# Patient Record
Sex: Male | Born: 1990 | Race: White | Hispanic: No | Marital: Single | Smoking: Current every day smoker
Health system: Southern US, Community
[De-identification: ages and names within clinical notes are randomized; demographics above are authoritative.]

## PROBLEM LIST (undated history)

## (undated) DIAGNOSIS — R569 Unspecified convulsions: Secondary | ICD-10-CM

## (undated) DIAGNOSIS — F419 Anxiety disorder, unspecified: Secondary | ICD-10-CM

## (undated) DIAGNOSIS — K029 Dental caries, unspecified: Secondary | ICD-10-CM

---

## 2013-07-06 ENCOUNTER — Encounter (HOSPITAL_COMMUNITY): Payer: Self-pay | Admitting: Emergency Medicine

## 2013-07-06 ENCOUNTER — Emergency Department (HOSPITAL_COMMUNITY)
Admission: EM | Admit: 2013-07-06 | Discharge: 2013-07-06 | Disposition: A | Discharge status: Home / Self Care | Payer: Self-pay | Attending: Emergency Medicine | Admitting: Emergency Medicine

## 2013-07-06 DIAGNOSIS — K089 Disorder of teeth and supporting structures, unspecified: Secondary | ICD-10-CM | POA: Insufficient documentation

## 2013-07-06 DIAGNOSIS — R509 Fever, unspecified: Secondary | ICD-10-CM | POA: Insufficient documentation

## 2013-07-06 DIAGNOSIS — K0889 Other specified disorders of teeth and supporting structures: Secondary | ICD-10-CM

## 2013-07-06 DIAGNOSIS — R110 Nausea: Secondary | ICD-10-CM | POA: Insufficient documentation

## 2013-07-06 HISTORY — DX: Dental caries, unspecified: K02.9

## 2013-07-06 HISTORY — DX: Anxiety disorder, unspecified: F41.9

## 2013-07-06 HISTORY — DX: Unspecified convulsions: R56.9

## 2013-07-06 MED ORDER — PENICILLIN V POTASSIUM 500 MG PO TABS: 500.0000 mg | ORAL_TABLET | Freq: Four times a day (QID) | ORAL | Status: AC

## 2013-07-06 MED ORDER — HYDROCODONE-ACETAMINOPHEN 5-325 MG PO TABS
2.0000 | ORAL_TABLET | Freq: Once | ORAL | Status: AC
  Administered 2013-07-06: 2 via ORAL
  Filled 2013-07-06: qty 2

## 2013-07-06 MED ORDER — HYDROCODONE-ACETAMINOPHEN 5-325 MG PO TABS: 1.0000 | ORAL_TABLET | Freq: Four times a day (QID) | ORAL | Status: DC | PRN

## 2013-07-06 NOTE — ED Notes (Signed)
Pain in upper l/back mouth. Hx of similar pain

## 2013-07-06 NOTE — ED Notes (Signed)
Pt states maxillary pain onset 07/04/13, has had abscess before pain 6/10

## 2013-07-06 NOTE — ED Provider Notes (Signed)
CSN: [id number]     Arrival date & time 07/06/13  1511 History  This chart was scribed for non-physician practitioner Santiago Glad, PA-C working with Flint Melter, MD by Danella Maiers, ED Scribe. This patient was seen in room WTR8/WTR8 and the patient's care was started at 3:22 PM.  Chief Complaint  Patient presents with  . Dental Pain    pain in upper l/mouth   The history is provided by the patient. No language interpreter was used.   HPI Comments: Chester Sibert is a 22 y.o. male who presents to the Emergency Department complaining of worsening left upper dental pain onset 2 days ago. He reports mild left-sided facial swelling, subjective fever, and nausea secondary to pain. Pt has been taking 800mg  ibuprofen with no relief. Pt reports h/o dental abscess. He denies any injuries to the area. He denies emesis, difficulty swallowing.  He currently does not have a dentist.     No past medical history on file. No past surgical history on file. No family history on file. History  Substance Use Topics  . Smoking status: Not on file  . Smokeless tobacco: Not on file  . Alcohol Use: Not on file    Review of Systems  Constitutional: Positive for fever.  HENT: Positive for dental problem. Negative for trouble swallowing.   Gastrointestinal: Positive for nausea. Negative for vomiting.   A complete 10 system review of systems was obtained and all systems are negative except as noted in the HPI and PMH.   Allergies  Review of patient's allergies indicates not on file.  Home Medications  No current outpatient prescriptions on file. BP 129/84  Pulse 81  Temp(Src) 98.6 F (37 C) (Oral)  Resp 17  SpO2 97% Physical Exam  Nursing note and vitals reviewed. Constitutional: He is oriented to person, place, and time. He appears well-developed and well-nourished. No distress.  HENT:  Head: Normocephalic and atraumatic.  Mouth/Throat: Uvula is midline, oropharynx is clear and  moist and mucous membranes are normal. No trismus in the jaw. Abnormal dentition. No dental abscesses or uvula swelling. No oropharyngeal exudate, posterior oropharyngeal edema, posterior oropharyngeal erythema or tonsillar abscesses.  Poor dental hygiene. Pt able to open and close mouth with out difficulty. Airway intact. Uvula midline. Mild gingival swelling with tenderness over affected area, but no fluctuance. No swelling or tenderness of submental and submandibular regions.  No sublingual tenderness or tongue elevation.  Eyes: Conjunctivae and EOM are normal. Pupils are equal, round, and reactive to light.  Neck: Normal range of motion and full passive range of motion without pain. Neck supple.  Cardiovascular: Normal rate, regular rhythm and normal heart sounds.   Pulmonary/Chest: Effort normal and breath sounds normal.  Musculoskeletal: Normal range of motion.  Lymphadenopathy:       Head (right side): No submental, no submandibular, no tonsillar, no preauricular and no posterior auricular adenopathy present.       Head (left side): No submental, no submandibular, no tonsillar, no preauricular and no posterior auricular adenopathy present.    He has no cervical adenopathy.  Neurological: He is alert and oriented to person, place, and time.  Skin: Skin is warm and dry. No rash noted. He is not diaphoretic.  Psychiatric: He has a normal mood and affect. His behavior is normal.    ED Course  Procedures (including critical care time) Medications - No data to display  DIAGNOSTIC STUDIES: Oxygen Saturation is 97% on RA, normal by my interpretation.  COORDINATION OF CARE: 4:05 PM- Discussed treatment plan with pt which includes antibiotics, pain medication, and dentist referral. Pt agrees to plan.    Labs Review Labs Reviewed - No data to display Imaging Review No results found.  EKG Interpretation   None       MDM  No diagnosis found. Patient with toothache.  No gross  abscess.  Exam unconcerning for Ludwig's angina or spread of infection.  Will treat with penicillin and pain medicine.  Urged patient to follow-up with dentist.  Return precautions given.  I personally performed the services described in this documentation, which was scribed in my presence. The recorded information has been reviewed and is accurate.    Santiago Glad, PA-C 07/06/13 [phone]

## 2013-07-07 NOTE — ED Provider Notes (Signed)
Medical screening examination/treatment/procedure(s) were performed by non-physician practitioner and as supervising physician I was immediately available for consultation/collaboration.  Lesia Monica L Wanda Cellucci, MD 07/07/13 0039 

## 2013-09-12 ENCOUNTER — Emergency Department (HOSPITAL_COMMUNITY): Payer: BC Managed Care – PPO

## 2013-09-12 ENCOUNTER — Emergency Department (HOSPITAL_COMMUNITY)
Admission: EM | Admit: 2013-09-12 | Discharge: 2013-09-12 | Disposition: A | Discharge status: Home / Self Care | Payer: BC Managed Care – PPO | Attending: Emergency Medicine | Admitting: Emergency Medicine

## 2013-09-12 DIAGNOSIS — R0602 Shortness of breath: Secondary | ICD-10-CM | POA: Insufficient documentation

## 2013-09-12 DIAGNOSIS — F17200 Nicotine dependence, unspecified, uncomplicated: Secondary | ICD-10-CM | POA: Insufficient documentation

## 2013-09-12 DIAGNOSIS — Z8659 Personal history of other mental and behavioral disorders: Secondary | ICD-10-CM | POA: Insufficient documentation

## 2013-09-12 DIAGNOSIS — B349 Viral infection, unspecified: Secondary | ICD-10-CM

## 2013-09-12 DIAGNOSIS — B9789 Other viral agents as the cause of diseases classified elsewhere: Secondary | ICD-10-CM | POA: Insufficient documentation

## 2013-09-12 DIAGNOSIS — Z8669 Personal history of other diseases of the nervous system and sense organs: Secondary | ICD-10-CM | POA: Insufficient documentation

## 2013-09-12 DIAGNOSIS — J029 Acute pharyngitis, unspecified: Secondary | ICD-10-CM | POA: Insufficient documentation

## 2013-09-12 DIAGNOSIS — Z8719 Personal history of other diseases of the digestive system: Secondary | ICD-10-CM | POA: Insufficient documentation

## 2013-09-12 DIAGNOSIS — R1110 Vomiting, unspecified: Secondary | ICD-10-CM | POA: Insufficient documentation

## 2013-09-12 LAB — CBC WITH DIFFERENTIAL/PLATELET
Basophils Absolute: 0 10*3/uL (ref 0.0–0.1)
Basophils Relative: 0 % (ref 0–1)
EOS ABS: 0 10*3/uL (ref 0.0–0.7)
Eosinophils Relative: 0 % (ref 0–5)
HEMATOCRIT: 40.9 % (ref 39.0–52.0)
HEMOGLOBIN: 14.2 g/dL (ref 13.0–17.0)
LYMPHS ABS: 2.5 10*3/uL (ref 0.7–4.0)
Lymphocytes Relative: 16 % (ref 12–46)
MCH: 28.7 pg (ref 26.0–34.0)
MCHC: 34.7 g/dL (ref 30.0–36.0)
MCV: 82.6 fL (ref 78.0–100.0)
MONOS PCT: 8 % (ref 3–12)
Monocytes Absolute: 1.3 10*3/uL — ABNORMAL HIGH (ref 0.1–1.0)
NEUTROS PCT: 76 % (ref 43–77)
Neutro Abs: 12.2 10*3/uL — ABNORMAL HIGH (ref 1.7–7.7)
Platelets: 238 10*3/uL (ref 150–400)
RBC: 4.95 MIL/uL (ref 4.22–5.81)
RDW: 12.8 % (ref 11.5–15.5)
WBC: 16 10*3/uL — ABNORMAL HIGH (ref 4.0–10.5)

## 2013-09-12 LAB — BASIC METABOLIC PANEL
BUN: 11 mg/dL (ref 6–23)
CHLORIDE: 102 meq/L (ref 96–112)
CO2: 25 mEq/L (ref 19–32)
Calcium: 9.1 mg/dL (ref 8.4–10.5)
Creatinine, Ser: 0.67 mg/dL (ref 0.50–1.35)
GFR calc Af Amer: >90 mL/min (ref >90)
GLUCOSE: 103 mg/dL — AB (ref 70–99)
Potassium: 4 mEq/L (ref 3.7–5.3)
Sodium: 138 mEq/L (ref 137–147)

## 2013-09-12 LAB — MONONUCLEOSIS SCREEN: Mono Screen: NEGATIVE

## 2013-09-12 LAB — RAPID STREP SCREEN (MED CTR MEBANE ONLY): Streptococcus, Group A Screen (Direct): NEGATIVE

## 2013-09-12 NOTE — ED Provider Notes (Signed)
CSN: [id number]     Arrival date & time 09/12/13  1134 History   First MD Initiated Contact with Patient 09/12/13 1143     Chief Complaint  Patient presents with  . Shortness of Breath  . Fatigue  . Headache     (Consider location/radiation/quality/duration/timing/severity/associated sxs/prior Treatment) HPI Comments: Patient is a 23 year old male with no significant past medical history. Presents today with complaints of fatigue, sore throat, headache, and generalized malaise for the past 5 days. States that he feels very weak and tired. He denies any ill contacts.  Patient is a 23 y.o. male presenting with shortness of breath and headaches. The history is provided by the patient.  Shortness of Breath Severity:  Moderate Onset quality:  Gradual Duration:  5 days Timing:  Constant Progression:  Worsening Chronicity:  New Context: activity and URI   Relieved by:  Nothing Worsened by:  Nothing tried Ineffective treatments:  None tried Associated symptoms: cough, fever, headaches and vomiting   Associated symptoms: no abdominal pain and no neck pain   Headache Associated symptoms: cough, fever and vomiting   Associated symptoms: no abdominal pain and no neck pain     Past Medical History  Diagnosis Date  . Dental caries   . Anxiety   . Seizures    No past surgical history on file. Family History  Problem Relation Age of Onset  . Cancer Other   . Diabetes Other    History  Substance Use Topics  . Smoking status: Current Every Day Smoker    Types: Cigarettes  . Smokeless tobacco: Not on file  . Alcohol Use: No    Review of Systems  Constitutional: Positive for fever.  Respiratory: Positive for cough and shortness of breath.   Gastrointestinal: Positive for vomiting. Negative for abdominal pain.  Musculoskeletal: Negative for neck pain.  Neurological: Positive for headaches.  All other systems reviewed and are negative.      Allergies  Review of patient's  allergies indicates no known allergies.  Home Medications   Current Outpatient Rx  Name  Route  Sig  Dispense  Refill  . HYDROcodone-acetaminophen (NORCO/VICODIN) 5-325 MG per tablet   Oral   Take 1-2 tablets by mouth every 6 (six) hours as needed.   15 tablet   0   . ibuprofen (ADVIL,MOTRIN) 200 MG tablet   Oral   Take 600 mg by mouth every 4 (four) hours as needed.          BP 125/79  Pulse 109  Temp(Src) 99 F (37.2 C)  Resp 19  Ht 5\' 8"  (1.727 m)  Wt 135 lb (61.236 kg)  BMI 20.53 kg/m2  SpO2 100% Physical Exam  Nursing note and vitals reviewed. Constitutional: He is oriented to person, place, and time. He appears well-developed and well-nourished. No distress.  HENT:  Head: Normocephalic and atraumatic.  Mouth/Throat: Oropharynx is clear and moist.  TMs are clear bilaterally.  Neck: Normal range of motion. Neck supple.  Cardiovascular: Normal rate, regular rhythm and normal heart sounds.   No murmur heard. Pulmonary/Chest: Effort normal and breath sounds normal. No respiratory distress. He has no wheezes.  Abdominal: Soft. Bowel sounds are normal. He exhibits no distension. There is no tenderness.  Musculoskeletal: Normal range of motion. He exhibits no edema.  Lymphadenopathy:    He has no cervical adenopathy.  Neurological: He is alert and oriented to person, place, and time.  Skin: Skin is warm and dry. He is not diaphoretic.  ED Course  Procedures (including critical care time) Labs Review Labs Reviewed  RAPID STREP SCREEN  CBC WITH DIFFERENTIAL  BASIC METABOLIC PANEL  MONONUCLEOSIS SCREEN   Imaging Review No results found.    MDM   Final diagnoses:  None    Patient is a 22 year old m24ale with history of seizures. He presents today with cough, congestion and generalized malaise for the past several days. His physical examination is unremarkable with the exception of mild erythema of the oropharynx. Workup reveals an elevated white count of  16,000, however all other studies are unremarkable including rapid strep, mono screen, and electrolytes. I suspect this is a viral process and we'll recommend plenty of fluids, ibuprofen, rest, and time. He is to return if he develops any additional problems.    Geoffery Lyonsouglas Lamisha Roussell, MD 09/12/13 1251

## 2013-09-12 NOTE — Discharge Instructions (Signed)
Drink plenty of fluids and get plenty of rest.  Ibuprofen 600 mg every 6 hours as needed for fever or pain.  Return to the emergency department if you develop difficulty breathing, severe chest or abdominal pain, or other new or concerning symptoms.   Viral Infections A viral infection can be caused by different types of viruses.Most viral infections are not serious and resolve on their own. However, some infections may cause severe symptoms and may lead to further complications. SYMPTOMS Viruses can frequently cause:  Minor sore throat.  Aches and pains.  Headaches.  Runny nose.  Different types of rashes.  Watery eyes.  Tiredness.  Cough.  Loss of appetite.  Gastrointestinal infections, resulting in nausea, vomiting, and diarrhea. These symptoms do not respond to antibiotics because the infection is not caused by bacteria. However, you might catch a bacterial infection following the viral infection. This is sometimes called a "superinfection." Symptoms of such a bacterial infection may include:  Worsening sore throat with pus and difficulty swallowing.  Swollen neck glands.  Chills and a high or persistent fever.  Severe headache.  Tenderness over the sinuses.  Persistent overall ill feeling (malaise), muscle aches, and tiredness (fatigue).  Persistent cough.  Yellow, green, or brown mucus production with coughing. HOME CARE INSTRUCTIONS   Only take over-the-counter or prescription medicines for pain, discomfort, diarrhea, or fever as directed by your caregiver.  Drink enough water and fluids to keep your urine clear or pale yellow. Sports drinks can provide valuable electrolytes, sugars, and hydration.  Get plenty of rest and maintain proper nutrition. Soups and broths with crackers or rice are fine. SEEK IMMEDIATE MEDICAL CARE IF:   You have severe headaches, shortness of breath, chest pain, neck pain, or an unusual rash.  You have uncontrolled  vomiting, diarrhea, or you are unable to keep down fluids.  You or your child has an oral temperature above 102 F (38.9 C), not controlled by medicine.  Your baby is older than 3 months with a rectal temperature of 102 F (38.9 C) or higher.  Your baby is 603 months old or younger with a rectal temperature of 100.4 F (38 C) or higher. MAKE SURE YOU:   Understand these instructions.  Will watch your condition.  Will get help right away if you are not doing well or get worse. Document Released: 04/13/2005 Document Revised: 09/26/2011 Document Reviewed: 11/08/2010 [facility] Patient Information 2014 [employer], [place].

## 2013-09-12 NOTE — ED Notes (Signed)
Per pt, states hes been feeling sick since Saturday. Pt states he thought he had a cold, progressively got worse, pt c/o chest pain and issues with breathing. Pt states hes had a headache. Four days ago he threw up x3. Pt states hes not having an appetite, states when he eats he feels sick. Pt took mucous relief today. Pt c/o mid abdominal pain.

## 2013-09-14 LAB — CULTURE, GROUP A STREP

## 2017-04-04 ENCOUNTER — Encounter (HOSPITAL_COMMUNITY): Payer: Self-pay | Admitting: Emergency Medicine

## 2017-04-04 ENCOUNTER — Ambulatory Visit (INDEPENDENT_AMBULATORY_CARE_PROVIDER_SITE_OTHER): Payer: Self-pay

## 2017-04-04 ENCOUNTER — Ambulatory Visit (HOSPITAL_COMMUNITY)
Admission: EM | Admit: 2017-04-04 | Discharge: 2017-04-04 | Disposition: A | Discharge status: Home / Self Care | Payer: Self-pay | Attending: Family Medicine | Admitting: Family Medicine

## 2017-04-04 DIAGNOSIS — J40 Bronchitis, not specified as acute or chronic: Secondary | ICD-10-CM

## 2017-04-04 MED ORDER — CETIRIZINE-PSEUDOEPHEDRINE ER 5-120 MG PO TB12: 1.0000 | ORAL_TABLET | Freq: Every day | ORAL | 0 refills | Status: DC

## 2017-04-04 MED ORDER — ALBUTEROL SULFATE HFA 108 (90 BASE) MCG/ACT IN AERS: 1.0000 | INHALATION_SPRAY | Freq: Four times a day (QID) | RESPIRATORY_TRACT | 0 refills | Status: DC | PRN

## 2017-04-04 MED ORDER — PREDNISONE 20 MG PO TABS: 40.0000 mg | ORAL_TABLET | Freq: Every day | ORAL | 0 refills | Status: AC

## 2017-04-04 MED ORDER — FLUTICASONE PROPIONATE 50 MCG/ACT NA SUSP: 2.0000 | Freq: Every day | NASAL | 0 refills | Status: DC

## 2017-04-04 MED ORDER — BENZONATATE 100 MG PO CAPS: 100.0000 mg | ORAL_CAPSULE | Freq: Three times a day (TID) | ORAL | 0 refills | Status: DC

## 2017-04-04 NOTE — ED Triage Notes (Signed)
Cough onset last week.  Yesterday started getting a pain in right ear.  Patient has had occasional dizziness.    Patient says he was diagnosed with pneumonia last Thursday 9/13.  Patient was also seen for diarrhea.  Patient says he finished medications that he was prescribed and told if not feeling better to be seen again.

## 2017-04-04 NOTE — ED Provider Notes (Signed)
[facility]    CSN: [id number] Arrival date & time: 04/04/17  1314     History   Chief Complaint Chief Complaint  Patient presents with  . Cough    HPI Lonnie Harris is a 26 y.o. male.   26 year old male with history of anxiety, seizures, comes in for continued coughing after treatment for pneumonia. Patient states coughing started 1 week ago, but has also had few months history of diarrhea. He was seen on 03/30/2017 at the urgent care, where he was diagnosed with pneumonia and colitis and was treated with azithromycin and ciprofloxacin. States he was told that if symptoms do not improve, to follow-up. Patient states he continues to have productive cough, and has had right ear pain with occasional dizziness. He also continues to have shortness of breath while coughing. He's had a few episodes of vomiting due to excessive coughing. Denies fever, chills, night sweats. He states that he originally was having 7-8 episodes of diarrhea each day, which has improved since finishing ciprofloxacin. States he is now having 3-4 episodes of loose stools each day. Has had some LLQ abdominal pain that is intermittent in nature. Prior to urgent care visit 1 week ago, he has not had recent antibiotic use or travel. He has been taking probiotics since he was put on antibiotics.       Past Medical History:  Diagnosis Date  . Anxiety   . Dental caries   . Seizures (HCC)     There are no active problems to display for this patient.   History reviewed. No pertinent surgical history.     Home Medications    Prior to Admission medications   Medication Sig Start Date End Date Taking? Authorizing Provider  albuterol (PROVENTIL HFA;VENTOLIN HFA) 108 (90 Base) MCG/ACT inhaler Inhale 1-2 puffs into the lungs every 6 (six) hours as needed for wheezing or shortness of breath. 04/04/17   Cathie Hoops, Lonie Newsham V, PA-C  benzonatate (TESSALON) 100 MG capsule Take 1 capsule (100 mg total) by mouth every 8  (eight) hours. 04/04/17   Cathie Hoops, Rafaelita Foister V, PA-C  cetirizine-pseudoephedrine (ZYRTEC-D) 5-120 MG tablet Take 1 tablet by mouth daily. 04/04/17   Cathie Hoops, Avnoor Koury V, PA-C  fluticasone (FLONASE) 50 MCG/ACT nasal spray Place 2 sprays into both nostrils daily. 04/04/17   Cathie Hoops, Adren Dollins V, PA-C  ibuprofen (ADVIL,MOTRIN) 200 MG tablet Take 600 mg by mouth every 4 (four) hours as needed.    [provider]  predniSONE (DELTASONE) 20 MG tablet Take 2 tablets (40 mg total) by mouth daily. 04/04/17 04/07/17  Belinda Fisher, PA-C    Family History Family History  Problem Relation Age of Onset  . Cancer Other   . Diabetes Other     Social History Social History  Substance Use Topics  . Smoking status: Current Every Day Smoker    Types: Cigarettes  . Smokeless tobacco: Not on file  . Alcohol use No     Allergies   Hydrocodone   Review of Systems Review of Systems  Reason unable to perform ROS: See HPI as above.     Physical Exam Triage Vital Signs ED Triage Vitals  Enc Vitals Group     BP 04/04/17 1329 132/82     Pulse Rate 04/04/17 1329 85     Resp 04/04/17 1329 (!) 22     Temp 04/04/17 1329 98.6 F (37 C)     Temp Source 04/04/17 1329 Oral     SpO2 04/04/17 1329 100 %  Weight --      Height --      Head Circumference --      Peak Flow --      Pain Score 04/04/17 1327 7     Pain Loc --      Pain Edu? --      Excl. in GC? --    No data found.   Updated Vital Signs BP 132/82 (BP Location: Right Arm)   Pulse 85   Temp 98.6 F (37 C) (Oral)   Resp (!) 22   SpO2 100%   Physical Exam  Constitutional: He is oriented to person, place, and time. He appears well-developed and well-nourished. No distress.  HENT:  Head: Normocephalic and atraumatic.  Right Ear: External ear and ear canal normal. Tympanic membrane is erythematous. Tympanic membrane is not bulging.  Left Ear: Tympanic membrane, external ear and ear canal normal. Tympanic membrane is not erythematous and not bulging.  Nose:  Mucosal edema present. Right sinus exhibits no maxillary sinus tenderness and no frontal sinus tenderness. Left sinus exhibits no maxillary sinus tenderness and no frontal sinus tenderness.  Mouth/Throat: Uvula is midline and mucous membranes are normal. Posterior oropharyngeal erythema present.  Eyes: Pupils are equal, round, and reactive to light. Conjunctivae are normal.  Neck: Normal range of motion. Neck supple.  Cardiovascular: Normal rate, regular rhythm and normal heart sounds.  Exam reveals no gallop and no friction rub.   No murmur heard. Pulmonary/Chest: Effort normal and breath sounds normal. He has no decreased breath sounds. He has no wheezes. He has no rhonchi. He has no rales.  Abdominal: Soft. Bowel sounds are normal. He exhibits no distension and no mass. There is tenderness (LUQ and LLQ). There is no rebound and no guarding.  Lymphadenopathy:    He has no cervical adenopathy.  Neurological: He is alert and oriented to person, place, and time.  Skin: Skin is warm and dry.  Psychiatric: He has a normal mood and affect. His behavior is normal. Judgment normal.     UC Treatments / Results  Labs (all labs ordered are listed, but only abnormal results are displayed) Labs Reviewed - No data to display  EKG  EKG Interpretation None       Radiology Dg Chest 2 View  Result Date: 04/04/2017 CLINICAL DATA:  Cough and shortness of breath for 1 week. Recently completed antibiotics. EXAM: CHEST  2 VIEW COMPARISON:  09/12/2013 radiographs FINDINGS: The cardiomediastinal silhouette is unremarkable. There is no evidence of focal airspace disease, pulmonary edema, suspicious pulmonary nodule/mass, pleural effusion, or pneumothorax. No acute bony abnormalities are identified. IMPRESSION: No active cardiopulmonary disease. Electronically Signed   By: Harmon Pier M.D.   On: 04/04/2017 14:02    Procedures Procedures (including critical care time)  Medications Ordered in  UC Medications - No data to display   Initial Impression / Assessment and Plan / UC Course  I have reviewed the triage vital signs and the nursing notes.  Pertinent labs & imaging results that were available during my care of the patient were reviewed by me and considered in my medical decision making (see chart for details).    Xray negative for pneumonia. Discussed with patient symptoms could be due to residual reactive airway. Short course of prednisone. Albuterol inhaler for shortness of breath/wheezing. Symptomatic treatment as needed. Push fluids. Patient to continue probiotics, advance diet as tolerated. Return precautions given.    Final Clinical Impressions(s) / UC Diagnoses   Final diagnoses:  Bronchitis    New Prescriptions Discharge Medication List as of 04/04/2017  2:22 PM    START taking these medications   Details  albuterol (PROVENTIL HFA;VENTOLIN HFA) 108 (90 Base) MCG/ACT inhaler Inhale 1-2 puffs into the lungs every 6 (six) hours as needed for wheezing or shortness of breath., Starting Tue 04/04/2017, Normal    benzonatate (TESSALON) 100 MG capsule Take 1 capsule (100 mg total) by mouth every 8 (eight) hours., Starting Tue 04/04/2017, Normal    cetirizine-pseudoephedrine (ZYRTEC-D) 5-120 MG tablet Take 1 tablet by mouth daily., Starting Tue 04/04/2017, Normal    fluticasone (FLONASE) 50 MCG/ACT nasal spray Place 2 sprays into both nostrils daily., Starting Tue 04/04/2017, Normal    predniSONE (DELTASONE) 20 MG tablet Take 2 tablets (40 mg total) by mouth daily., Starting Tue 04/04/2017, Until Fri 04/07/2017, Normal          Linward Headland V, PA-C 04/04/17 1434

## 2017-04-04 NOTE — Discharge Instructions (Signed)
Xray negative for pneumonia. Tessalon for cough. Albuterol for shortness of breath and wheezing. Start flonase, zyrtec-D for nasal congestion. You can use over the counter nasal saline rinse such as neti pot for nasal congestion. Keep hydrated, your urine should be clear to pale yellow in color. Tylenol/motrin for fever and pain. Continue probiotics for diarrhea. Monitor for any worsening of symptoms, chest pain, shortness of breath, wheezing, swelling of the throat, increased abdominal pain, nausea/vomiting follow up for reevaluation.

## 2018-03-19 ENCOUNTER — Encounter (HOSPITAL_COMMUNITY): Payer: Self-pay

## 2018-03-19 ENCOUNTER — Emergency Department (HOSPITAL_COMMUNITY)
Admission: EM | Admit: 2018-03-19 | Discharge: 2018-03-19 | Disposition: A | Discharge status: Home / Self Care | Payer: Self-pay | Attending: Emergency Medicine | Admitting: Emergency Medicine

## 2018-03-19 ENCOUNTER — Other Ambulatory Visit: Payer: Self-pay

## 2018-03-19 DIAGNOSIS — F17210 Nicotine dependence, cigarettes, uncomplicated: Secondary | ICD-10-CM | POA: Insufficient documentation

## 2018-03-19 DIAGNOSIS — Z79899 Other long term (current) drug therapy: Secondary | ICD-10-CM | POA: Insufficient documentation

## 2018-03-19 DIAGNOSIS — H16001 Unspecified corneal ulcer, right eye: Secondary | ICD-10-CM | POA: Insufficient documentation

## 2018-03-19 MED ORDER — TETRACAINE HCL 0.5 % OP SOLN
2.0000 [drp] | Freq: Once | OPHTHALMIC | Status: AC
  Administered 2018-03-19: 2 [drp] via OPHTHALMIC
  Filled 2018-03-19: qty 4

## 2018-03-19 MED ORDER — OFLOXACIN 0.3 % OP SOLN: 1.0000 [drp] | Freq: Three times a day (TID) | OPHTHALMIC | 0 refills | Status: AC

## 2018-03-19 MED ORDER — FLUORESCEIN SODIUM 1 MG OP STRP
1.0000 | ORAL_STRIP | Freq: Once | OPHTHALMIC | Status: AC
  Administered 2018-03-19: 1 via OPHTHALMIC
  Filled 2018-03-19: qty 1

## 2018-03-19 NOTE — Discharge Instructions (Signed)
Please apply 1 drop to the right eye up to 3 times a day. Please schedule appointment with Opthalmologic for reevaluation after completing therapy. If you experience any changes in vision or vision loss please return to the ED.

## 2018-03-19 NOTE — ED Provider Notes (Signed)
[facility] EMERGENCY DEPARTMENT Provider Note   CSN: [id number] Arrival date & time: 03/19/18  1244     History   Chief Complaint Chief Complaint  Patient presents with  . contact stuck in eye    HPI Lonnie Harris is a 27 y.o. male.  27 y/o male with a PMH of anxiety presents to the ED with a chief complaint of right eye pain since last night. Patient states he was removing his contact lenses when he noticed a piece of the contact lens was missing. He reports the piece to be the size of  "pencil tip". Patient reports clear tearing from right eye and pain. He has not tried any therapy for symptomatic treatment. He denies any changes in vision or vision loss.      Past Medical History:  Diagnosis Date  . Anxiety   . Dental caries   . Seizures (HCC)     There are no active problems to display for this patient.   History reviewed. No pertinent surgical history.      Home Medications    Prior to Admission medications   Medication Sig Start Date End Date Taking? Authorizing Provider  albuterol (PROVENTIL HFA;VENTOLIN HFA) 108 (90 Base) MCG/ACT inhaler Inhale 1-2 puffs into the lungs every 6 (six) hours as needed for wheezing or shortness of breath. 04/04/17   Cathie Hoops, Amy V, PA-C  benzonatate (TESSALON) 100 MG capsule Take 1 capsule (100 mg total) by mouth every 8 (eight) hours. 04/04/17   Cathie Hoops, Amy V, PA-C  cetirizine-pseudoephedrine (ZYRTEC-D) 5-120 MG tablet Take 1 tablet by mouth daily. 04/04/17   Cathie Hoops, Amy V, PA-C  fluticasone (FLONASE) 50 MCG/ACT nasal spray Place 2 sprays into both nostrils daily. 04/04/17   Cathie Hoops, Amy V, PA-C  ibuprofen (ADVIL,MOTRIN) 200 MG tablet Take 600 mg by mouth every 4 (four) hours as needed.    [provider]  ofloxacin (OCUFLOX) 0.3 % ophthalmic solution Place 1 drop into the right eye 3 (three) times daily for 7 days. 03/19/18 03/26/18  Claude Manges, PA-C    Family History Family History  Problem Relation Age of Onset    . Cancer Other   . Diabetes Other     Social History Social History   Tobacco Use  . Smoking status: Current Every Day Smoker    Types: Cigarettes  Substance Use Topics  . Alcohol use: No  . Drug use: Not on file     Allergies   Hydrocodone   Review of Systems Review of Systems  HENT: Negative for sinus pressure, sinus pain and sore throat.   Eyes: Positive for pain, discharge and redness. Negative for photophobia and visual disturbance.  All other systems reviewed and are negative.    Physical Exam Updated Vital Signs BP 120/79 (BP Location: Right Arm)   Pulse 77   Temp 98.5 F (36.9 C) (Oral)   Resp 16   SpO2 99%   Physical Exam  Constitutional: He is oriented to person, place, and time. He appears well-developed and well-nourished.  HENT:  Head: Normocephalic and atraumatic.  Eyes: Right eye exhibits discharge. Right conjunctiva is injected. Right conjunctiva has no hemorrhage. Right eye exhibits normal extraocular motion. Left eye exhibits normal extraocular motion. Right pupil is round and reactive. Left pupil is round and reactive. Pupils are equal.  Slit lamp exam:      The right eye shows fluorescein uptake. The right eye shows no corneal flare, no corneal ulcer, no foreign body, no  hyphema and no hypopyon.  Clear drainage from right eye, no drainage from left eye.   Neck: Normal range of motion. Neck supple.  Cardiovascular: Normal heart sounds.  Pulmonary/Chest: Effort normal. He has no wheezes.  Abdominal: Soft.  Musculoskeletal: He exhibits no tenderness or deformity.  Neurological: He is alert and oriented to person, place, and time.  Skin: Skin is warm and dry.  Nursing note and vitals reviewed.    Visual Acuity  Right Eye Distance: 20/100 Left Eye Distance: 20/50 Bilateral Distance: (S) 20/50(without contacts)  Right Eye Near:   Left Eye Near:    Bilateral Near:       ED Treatments / Results  Labs (all labs ordered are listed, but  only abnormal results are displayed) Labs Reviewed - No data to display  EKG None  Radiology No results found.  Procedures Procedures (including critical care time)  Medications Ordered in ED Medications  tetracaine (PONTOCAINE) 0.5 % ophthalmic solution 2 drop (2 drops Right Eye Given 03/19/18 1339)  fluorescein ophthalmic strip 1 strip (1 strip Right Eye Given 03/19/18 1339)     Initial Impression / Assessment and Plan / ED Course  I have reviewed the triage vital signs and the nursing notes.  Pertinent labs & imaging results that were available during my care of the patient were reviewed by me and considered in my medical decision making (see chart for details).     Patient presents with pain in right eye after contact lens removal. He states a piece of contact lens was missing from his contact upon removing it last night. Patient notices increase in lacrimation of right eye. He denies any blurry vision or vision loss. During examination there is an ulcer present @ 6 o clock will prescribe patient ofloxacin drops and provide him referral for ophthalmology follow up after therapy completion. Return precautions provided.   Final Clinical Impressions(s) / ED Diagnoses   Final diagnoses:  Ulcer of right cornea    ED Discharge Orders         Ordered    ofloxacin (OCUFLOX) 0.3 % ophthalmic solution  3 times daily     03/19/18 1439           Claude Manges, PA-C 03/19/18 1443    Raeford Razor, MD 03/20/18 0730

## 2018-03-19 NOTE — ED Triage Notes (Signed)
Patient here with right eye drainage and discomfort. reports removed contact this am and it was ripped. Thinks part of contact stuck in eye

## 2018-06-06 ENCOUNTER — Emergency Department (HOSPITAL_COMMUNITY)
Admission: EM | Admit: 2018-06-06 | Discharge: 2018-06-06 | Disposition: A | Discharge status: Home / Self Care | Payer: Self-pay | Attending: Emergency Medicine | Admitting: Emergency Medicine

## 2018-06-06 ENCOUNTER — Other Ambulatory Visit: Payer: Self-pay

## 2018-06-06 ENCOUNTER — Encounter (HOSPITAL_COMMUNITY): Payer: Self-pay | Admitting: Emergency Medicine

## 2018-06-06 DIAGNOSIS — R1012 Left upper quadrant pain: Secondary | ICD-10-CM | POA: Insufficient documentation

## 2018-06-06 DIAGNOSIS — Z79899 Other long term (current) drug therapy: Secondary | ICD-10-CM | POA: Insufficient documentation

## 2018-06-06 DIAGNOSIS — F17210 Nicotine dependence, cigarettes, uncomplicated: Secondary | ICD-10-CM | POA: Insufficient documentation

## 2018-06-06 DIAGNOSIS — F419 Anxiety disorder, unspecified: Secondary | ICD-10-CM | POA: Insufficient documentation

## 2018-06-06 LAB — URINALYSIS, ROUTINE W REFLEX MICROSCOPIC
Bilirubin Urine: NEGATIVE
Glucose, UA: NEGATIVE mg/dL
Hgb urine dipstick: NEGATIVE
Ketones, ur: NEGATIVE mg/dL
Leukocytes, UA: NEGATIVE
NITRITE: NEGATIVE
Protein, ur: NEGATIVE mg/dL
Specific Gravity, Urine: 1.003 — ABNORMAL LOW (ref 1.005–1.030)
pH: 6 (ref 5.0–8.0)

## 2018-06-06 LAB — CBC WITH DIFFERENTIAL/PLATELET
Abs Immature Granulocytes: 0.04 10*3/uL (ref 0.00–0.07)
BASOS PCT: 0 %
Basophils Absolute: 0 10*3/uL (ref 0.0–0.1)
Eosinophils Absolute: 0.3 10*3/uL (ref 0.0–0.5)
Eosinophils Relative: 2 %
HCT: 45.9 % (ref 39.0–52.0)
Hemoglobin: 14.8 g/dL (ref 13.0–17.0)
Immature Granulocytes: 0 %
Lymphocytes Relative: 32 %
Lymphs Abs: 3.4 10*3/uL (ref 0.7–4.0)
MCH: 27.4 pg (ref 26.0–34.0)
MCHC: 32.2 g/dL (ref 30.0–36.0)
MCV: 84.8 fL (ref 80.0–100.0)
Monocytes Absolute: 0.7 10*3/uL (ref 0.1–1.0)
Monocytes Relative: 6 %
Neutro Abs: 6.2 10*3/uL (ref 1.7–7.7)
Neutrophils Relative %: 60 %
PLATELETS: 302 10*3/uL (ref 150–400)
RBC: 5.41 MIL/uL (ref 4.22–5.81)
RDW: 12.8 % (ref 11.5–15.5)
WBC: 10.6 10*3/uL — AB (ref 4.0–10.5)
nRBC: 0 % (ref 0.0–0.2)

## 2018-06-06 LAB — COMPREHENSIVE METABOLIC PANEL
ALT: 40 U/L (ref 0–44)
AST: 30 U/L (ref 15–41)
Albumin: 4.4 g/dL (ref 3.5–5.0)
Alkaline Phosphatase: 55 U/L (ref 38–126)
Anion gap: 8 (ref 5–15)
BUN: 7 mg/dL (ref 6–20)
CHLORIDE: 104 mmol/L (ref 98–111)
CO2: 26 mmol/L (ref 22–32)
Calcium: 9.7 mg/dL (ref 8.9–10.3)
Creatinine, Ser: 0.84 mg/dL (ref 0.61–1.24)
GFR calc Af Amer: >60 mL/min (ref >60)
Glucose, Bld: 88 mg/dL (ref 70–99)
POTASSIUM: 3.4 mmol/L — AB (ref 3.5–5.1)
SODIUM: 138 mmol/L (ref 135–145)
Total Bilirubin: 0.8 mg/dL (ref 0.3–1.2)
Total Protein: 7.4 g/dL (ref 6.5–8.1)

## 2018-06-06 LAB — LIPASE, BLOOD: LIPASE: 33 U/L (ref 11–51)

## 2018-06-06 MED ORDER — IBUPROFEN 600 MG PO TABS: 600.0000 mg | ORAL_TABLET | Freq: Four times a day (QID) | ORAL | 0 refills | Status: DC | PRN

## 2018-06-06 MED ORDER — ONDANSETRON HCL 4 MG/2ML IJ SOLN
4.0000 mg | Freq: Once | INTRAMUSCULAR | Status: AC
  Administered 2018-06-06: 4 mg via INTRAVENOUS
  Filled 2018-06-06: qty 2

## 2018-06-06 MED ORDER — SODIUM CHLORIDE 0.9 % IV SOLN
1000.0000 mL | Freq: Once | INTRAVENOUS | Status: AC
  Administered 2018-06-06: 1000 mL via INTRAVENOUS

## 2018-06-06 MED ORDER — CYCLOBENZAPRINE HCL 5 MG PO TABS: 5.0000 mg | ORAL_TABLET | Freq: Two times a day (BID) | ORAL | 0 refills | Status: DC | PRN

## 2018-06-06 MED ORDER — MORPHINE SULFATE (PF) 4 MG/ML IV SOLN
4.0000 mg | Freq: Once | INTRAVENOUS | Status: AC
  Administered 2018-06-06: 4 mg via INTRAVENOUS
  Filled 2018-06-06: qty 1

## 2018-06-06 NOTE — ED Triage Notes (Signed)
Pt complains of back pain since last Thursday, 11/14, abd pains for the last 3 days, and diarrhea that started this morning. Pt reports 6 bouts of diarrhea since this morning. Pt denies any injuries.

## 2018-06-06 NOTE — ED Provider Notes (Signed)
[facility]ERGENC[facility] Provider Note   CSN: [id number] Arrival date & time: 06/06/18  1901     History   Chief Complaint No chief complaint on file.   HPI Lonnie Harris is a 27 y.o. male.  The history is provided by the patient and medical records. No language interpreter was used.     27 year old male with history of anxiety, prior seizures, presenting for evaluation of abdominal pain.  Patient reports 6 days ago he developed pain primarily to the left side of his abdomen radiates to his back.  Pain is been persistent, described as a dull sensation with occasional sharp pain worsening with movement with bending over.  For the past 2 days he endorsed feeling nauseous, and has had persistent loose stools with some mucus.  Pain is been waxing and waning, currently rates the pain as 4 out of 10.  He endorsed persistent nausea but without vomiting.  Does not report of any fever chills, chest pain, shortness of breath, productive cough, dysuria, hematuria, hematochezia or melena.  He tries taking ibuprofen at home with some improvement.  He was seen by urgent care earlier today for his symptoms but was recommended to come to the ER for further evaluation of his pain.  Denies alcohol use, he does vape.  He denies any recent travel, eating exotic food, or any recent antibiotic use.  Patient also denies any testicular pain or scrotal pain.  No penile discharge per  Past Medical History:  Diagnosis Date  . Anxiety   . Dental caries   . Seizures (HCC)     There are no active problems to display for this patient.   No past surgical history on file.      Home Medications    Prior to Admission medications   Medication Sig Start Date End Date Taking? Authorizing Provider  albuterol (PROVENTIL HFA;VENTOLIN HFA) 108 (90 Base) MCG/ACT inhaler Inhale 1-2 puffs into the lungs every 6 (six) hours as needed for wheezing or shortness of breath. 04/04/17   Cathie HoopsYu, Amy  V, PA-C  benzonatate (TESSALON) 100 MG capsule Take 1 capsule (100 mg total) by mouth every 8 (eight) hours. 04/04/17   Cathie HoopsYu, Amy V, PA-C  cetirizine-pseudoephedrine (ZYRTEC-D) 5-120 MG tablet Take 1 tablet by mouth daily. 04/04/17   Cathie HoopsYu, Amy V, PA-C  fluticasone (FLONASE) 50 MCG/ACT nasal spray Place 2 sprays into both nostrils daily. 04/04/17   Cathie HoopsYu, Amy V, PA-C  ibuprofen (ADVIL,MOTRIN) 200 MG tablet Take 600 mg by mouth every 4 (four) hours as needed.    [provider]    Family History Family History  Problem Relation Age of Onset  . Cancer Other   . Diabetes Other     Social History Social History   Tobacco Use  . Smoking status: Current Every Day Smoker    Types: Cigarettes  Substance Use Topics  . Alcohol use: No  . Drug use: Not on file     Allergies   Hydrocodone   Review of Systems Review of Systems  All other systems reviewed and are negative.    Physical Exam Updated Vital Signs There were no vitals taken for this visit.  Physical Exam  Constitutional: He appears well-developed and well-nourished. No distress.  HENT:  Head: Atraumatic.  Eyes: Conjunctivae are normal.  Neck: Neck supple.  Cardiovascular: Normal rate and regular rhythm.  Pulmonary/Chest: Effort normal and breath sounds normal.  Abdominal: Soft. Normal appearance. There is tenderness in the left upper  quadrant. There is no CVA tenderness, no tenderness at McBurney's point and negative Murphy's sign.  Neurological: He is alert.  Skin: No rash noted.  Psychiatric: He has a normal mood and affect.  Nursing note and vitals reviewed.    ED Treatments / Results  Labs (all labs ordered are listed, but only abnormal results are displayed) Labs Reviewed  CBC WITH DIFFERENTIAL/PLATELET - Abnormal; Notable for the following components:      Result Value   WBC 10.6 (*)    All other components within normal limits  COMPREHENSIVE METABOLIC PANEL - Abnormal; Notable for the following  components:   Potassium 3.4 (*)    All other components within normal limits  URINALYSIS, ROUTINE W REFLEX MICROSCOPIC - Abnormal; Notable for the following components:   Color, Urine STRAW (*)    Specific Gravity, Urine 1.003 (*)    All other components within normal limits  LIPASE, BLOOD    EKG None  Radiology No results found.  Procedures Procedures (including critical care time)  Medications Ordered in ED Medications  0.9 %  sodium chloride infusion (0 mLs Intravenous Stopped 06/06/18 2040)  morphine 4 MG/ML injection 4 mg (4 mg Intravenous Given 06/06/18 1934)  ondansetron (ZOFRAN) injection 4 mg (4 mg Intravenous Given 06/06/18 1934)     Initial Impression / Assessment and Plan / ED Course  I have reviewed the triage vital signs and the nursing notes.  Pertinent labs & imaging results that were available during my care of the patient were reviewed by me and considered in my medical decision making (see chart for details).     BP (!) 106/59   Pulse 67   Temp 98.2 F (36.8 C) (Oral)   Resp 16   Ht 5\' 10"  (1.778 m)   Wt 61.2 kg   SpO2 99%   BMI 19.36 kg/m    Final Clinical Impressions(s) / ED Diagnoses   Final diagnoses:  LUQ pain    ED Discharge Orders         Ordered    ibuprofen (ADVIL,MOTRIN) 600 MG tablet  Every 6 hours PRN     06/06/18 2111    cyclobenzaprine (FLEXERIL) 5 MG tablet  2 times daily PRN     06/06/18 2111         7:20 PM Patient here with pain to his left upper quadrant and back ongoing for nearly a week.  He does have some reproducible pain to his left upper quadrant on exam but no tenderness to right lower quadrant and no CVA tenderness.  9:08 PM Pain did improve after treatment.  Labs are reassuring.  Normal lipase, normal UA.  Doubt acute emergent medical condition.  Pt given strict return precaution.  Pain likely MSK, will prescribe NSAIDs and muscle relaxant.     Fayrene Helper, PA-C 06/06/18 2112    Azalia Bilis,  MD 06/07/18 [phone]

## 2018-06-06 NOTE — ED Notes (Signed)
Patient verbalizes understanding of discharge instructions. Opportunity for questioning and answers were provided. Armband removed by staff, pt discharged from ED home via POV with family. 

## 2018-11-30 ENCOUNTER — Emergency Department (HOSPITAL_COMMUNITY): Payer: Self-pay

## 2018-11-30 ENCOUNTER — Other Ambulatory Visit: Payer: Self-pay

## 2018-11-30 ENCOUNTER — Encounter (HOSPITAL_COMMUNITY): Payer: Self-pay

## 2018-11-30 ENCOUNTER — Emergency Department (HOSPITAL_COMMUNITY)
Admission: EM | Admit: 2018-11-30 | Discharge: 2018-12-01 | Disposition: A | Discharge status: Home / Self Care | Payer: Self-pay | Attending: Emergency Medicine | Admitting: Emergency Medicine

## 2018-11-30 DIAGNOSIS — F17210 Nicotine dependence, cigarettes, uncomplicated: Secondary | ICD-10-CM | POA: Insufficient documentation

## 2018-11-30 DIAGNOSIS — R569 Unspecified convulsions: Secondary | ICD-10-CM | POA: Insufficient documentation

## 2018-11-30 DIAGNOSIS — K529 Noninfective gastroenteritis and colitis, unspecified: Secondary | ICD-10-CM | POA: Insufficient documentation

## 2018-11-30 LAB — COMPREHENSIVE METABOLIC PANEL
ALT: 16 U/L (ref 0–44)
AST: 16 U/L (ref 15–41)
Albumin: 4.3 g/dL (ref 3.5–5.0)
Alkaline Phosphatase: 61 U/L (ref 38–126)
Anion gap: 8 (ref 5–15)
BUN: 5 mg/dL — ABNORMAL LOW (ref 6–20)
CO2: 25 mmol/L (ref 22–32)
Calcium: 9.5 mg/dL (ref 8.9–10.3)
Chloride: 105 mmol/L (ref 98–111)
Creatinine, Ser: 0.96 mg/dL (ref 0.61–1.24)
GFR calc Af Amer: >60 mL/min (ref >60)
GFR calc non Af Amer: >60 mL/min (ref >60)
Glucose, Bld: 93 mg/dL (ref 70–99)
Potassium: 3.7 mmol/L (ref 3.5–5.1)
Sodium: 138 mmol/L (ref 135–145)
Total Bilirubin: 1.2 mg/dL (ref 0.3–1.2)
Total Protein: 6.9 g/dL (ref 6.5–8.1)

## 2018-11-30 LAB — CBC
HCT: 43 % (ref 39.0–52.0)
Hemoglobin: 14.5 g/dL (ref 13.0–17.0)
MCH: 28.4 pg (ref 26.0–34.0)
MCHC: 33.7 g/dL (ref 30.0–36.0)
MCV: 84.1 fL (ref 80.0–100.0)
Platelets: 289 10*3/uL (ref 150–400)
RBC: 5.11 MIL/uL (ref 4.22–5.81)
RDW: 12.7 % (ref 11.5–15.5)
WBC: 8 10*3/uL (ref 4.0–10.5)
nRBC: 0 % (ref 0.0–0.2)

## 2018-11-30 LAB — LIPASE, BLOOD: Lipase: 67 U/L — ABNORMAL HIGH (ref 11–51)

## 2018-11-30 MED ORDER — FENTANYL CITRATE (PF) 100 MCG/2ML IJ SOLN
25.0000 ug | Freq: Once | INTRAMUSCULAR | Status: AC
  Administered 2018-11-30: 25 ug via INTRAVENOUS
  Filled 2018-11-30: qty 2

## 2018-11-30 MED ORDER — ONDANSETRON HCL 4 MG/2ML IJ SOLN
4.0000 mg | Freq: Once | INTRAMUSCULAR | Status: AC
  Administered 2018-11-30: 4 mg via INTRAVENOUS
  Filled 2018-11-30: qty 2

## 2018-11-30 MED ORDER — ONDANSETRON 4 MG PO TBDP: 4.0000 mg | ORAL_TABLET | Freq: Three times a day (TID) | ORAL | 0 refills | Status: AC | PRN

## 2018-11-30 MED ORDER — PANTOPRAZOLE SODIUM 20 MG PO TBEC: 20.0000 mg | DELAYED_RELEASE_TABLET | Freq: Every day | ORAL | 0 refills | Status: AC

## 2018-11-30 MED ORDER — SODIUM CHLORIDE 0.9 % IV BOLUS
1000.0000 mL | Freq: Once | INTRAVENOUS | Status: AC
  Administered 2018-11-30: 23:00:00 1000 mL via INTRAVENOUS

## 2018-11-30 MED ORDER — SODIUM CHLORIDE 0.9% FLUSH: 3.0000 mL | Freq: Once | INTRAVENOUS | Status: DC

## 2018-11-30 MED ORDER — IOHEXOL 300 MG/ML  SOLN
100.0000 mL | Freq: Once | INTRAMUSCULAR | Status: AC | PRN
  Administered 2018-11-30: 100 mL via INTRAVENOUS

## 2018-11-30 NOTE — ED Provider Notes (Signed)
[facility] EMERGENCY DEPARTMENT Provider Note   CSN: [id number] Arrival date & time: 11/30/18  1910    History   Chief Complaint Chief Complaint  Patient presents with  . Abdominal Pain  . Emesis  . Diarrhea    HPI Lonnie Harris is a 28 y.o. male.     28 year old male with history of dental caries and seizures presents to the ED for complaints of abdominal pain x3 days.  Reports that abdominal pain has been on the left side.  It is nonradiating, constant.  Symptoms associated with nausea as well as vomiting and diarrhea.  He has had approximately 5-6 episodes of emesis per day.  Went to urgent care earlier today and was given antiemetics and Imodium.  Told to present to the ED if he was unable to tolerate fluids.  Tried drinking some Gatorade at home, but vomited shortly after.  Denies any sick contacts as well as any fevers, hematemesis, dysuria, melena, hematochezia, history of abdominal surgeries.  Denies taking any additional medications prior to arrival.  The history is provided by the patient. No language interpreter was used.  Abdominal Pain  Associated symptoms: diarrhea and vomiting   Emesis  Associated symptoms: abdominal pain and diarrhea   Diarrhea  Associated symptoms: abdominal pain and vomiting     Past Medical History:  Diagnosis Date  . Anxiety   . Dental caries   . Seizures (HCC)     There are no active problems to display for this patient.   History reviewed. No pertinent surgical history.      Home Medications    Prior to Admission medications   Medication Sig Start Date End Date Taking? Authorizing Provider  loperamide (IMODIUM A-D) 2 MG tablet Take 2 mg by mouth 4 (four) times daily as needed for diarrhea or loose stools.   Yes [provider]  ondansetron (ZOFRAN) 8 MG tablet Take 8 mg by mouth every 6 (six) hours as needed for nausea or vomiting.   Yes [provider]  ondansetron (ZOFRAN ODT) 4  MG disintegrating tablet Take 1 tablet (4 mg total) by mouth every 8 (eight) hours as needed for nausea or vomiting. 11/30/18   Antony Madura, PA-C  pantoprazole (PROTONIX) 20 MG tablet Take 1 tablet (20 mg total) by mouth daily. 11/30/18   Antony Madura, PA-C    Family History Family History  Problem Relation Age of Onset  . Cancer Other   . Diabetes Other     Social History Social History   Tobacco Use  . Smoking status: Current Every Day Smoker    Types: Cigarettes  . Smokeless tobacco: Never Used  Substance Use Topics  . Alcohol use: No  . Drug use: Yes    Frequency: 7.0 times per week    Types: Marijuana     Allergies   Hydrocodone   Review of Systems Review of Systems  Gastrointestinal: Positive for abdominal pain, diarrhea and vomiting.  Ten systems reviewed and are negative for acute change, except as noted in the HPI.    Physical Exam Updated Vital Signs BP 123/87 (BP Location: Right Arm)   Pulse 72   Temp 98.8 F (37.1 C) (Oral)   Resp 16   Ht  (1.778 m)   Wt 62.6 kg   SpO2 100%   BMI 19.80 kg/m   Physical Exam Vitals signs and nursing note reviewed.  Constitutional:      General: He is not in acute distress.  Appearance: He is well-developed. He is not diaphoretic.     Comments: Nontoxic appearing and in NAD  HENT:     Head: Normocephalic and atraumatic.  Eyes:     General: No scleral icterus.    Conjunctiva/sclera: Conjunctivae normal.  Neck:     Musculoskeletal: Normal range of motion.  Cardiovascular:     Rate and Rhythm: Normal rate and regular rhythm.     Pulses: Normal pulses.  Pulmonary:     Effort: Pulmonary effort is normal. No respiratory distress.     Comments: Respirations even and unlabored Abdominal:     Comments: TTP in the LUQ and suprapubic abdomen. No distension, peritoneal signs, involuntary guarding.  Musculoskeletal: Normal range of motion.  Skin:    General: Skin is warm and dry.     Coloration: Skin is not  pale.     Findings: No erythema or rash.  Neurological:     Mental Status: He is alert and oriented to person, place, and time.     Coordination: Coordination normal.  Psychiatric:        Behavior: Behavior normal.      ED Treatments / Results  Labs (all labs ordered are listed, but only abnormal results are displayed) Labs Reviewed  LIPASE, BLOOD - Abnormal; Notable for the following components:      Result Value   Lipase 67 (*)    All other components within normal limits  COMPREHENSIVE METABOLIC PANEL - Abnormal; Notable for the following components:   BUN 5 (*)    All other components within normal limits  URINALYSIS, ROUTINE W REFLEX MICROSCOPIC - Abnormal; Notable for the following components:   Color, Urine STRAW (*)    Specific Gravity, Urine 1.040 (*)    Ketones, ur 5 (*)    All other components within normal limits  CBC    EKG None  Radiology Ct Abdomen Pelvis W Contrast  Result Date: 11/30/2018 CLINICAL DATA:  Abdominal pain, nausea and diarrhea for 2 days. EXAM: CT ABDOMEN AND PELVIS WITH CONTRAST TECHNIQUE: Multidetector CT imaging of the abdomen and pelvis was performed using the standard protocol following bolus administration of intravenous contrast. CONTRAST:  100 mL OMNIPAQUE IOHEXOL 300 MG/ML  SOLN COMPARISON:  None. FINDINGS: Lower chest: Lung bases clear. Heart size normal. No pneumothorax or pleural fluid. Hepatobiliary: No focal liver abnormality is seen. No gallstones, gallbladder wall thickening, or biliary dilatation. Pancreas: Unremarkable. No pancreatic ductal dilatation or surrounding inflammatory changes. Spleen: Normal in size without focal abnormality. Adrenals/Urinary Tract: Adrenal glands are unremarkable. Kidneys are normal, without renal calculi, focal lesion, or hydronephrosis. Bladder is unremarkable. Stomach/Bowel: Stomach is within normal limits. Appendix appears normal. No evidence of bowel wall thickening, distention, or inflammatory  changes. Vascular/Lymphatic: No significant vascular findings are present. No enlarged abdominal or pelvic lymph nodes. Reproductive: Prostate is unremarkable. Other: None. Musculoskeletal: Negative. IMPRESSION: Negative CT abdomen and pelvis. No finding to explain the patient's symptoms. Electronically Signed   By: Drusilla Kanner M.D.   On: 11/30/2018 23:31    Procedures Procedures (including critical care time)  Medications Ordered in ED Medications  sodium chloride 0.9 % bolus 1,000 mL (0 mLs Intravenous Stopped 12/01/18 0026)  ondansetron (ZOFRAN) injection 4 mg (4 mg Intravenous Given 11/30/18 2239)  fentaNYL (SUBLIMAZE) injection 25 mcg (25 mcg Intravenous Given 11/30/18 2241)  iohexol (OMNIPAQUE) 300 MG/ML solution 100 mL (100 mLs Intravenous Contrast Given 11/30/18 2303)     Initial Impression / Assessment and Plan / ED Course  I have reviewed the triage vital signs and the nursing notes.  Pertinent labs & imaging results that were available during my care of the patient were reviewed by me and considered in my medical decision making (see chart for details).        Patient with symptoms consistent with viral gastroenteritis.  Vitals are stable, no fever.  Tolerating PO fluids after Zofran and IVF.  Patient's laboratory evaluation is reassuring.  He did undergo CT scan of the abdomen and pelvis which does not show any acute or emergent cause of symptoms.  He is feeling better on repeat assessment.  Will continue with outpatient management with Zofran.  Encouraged follow-up with a primary care doctor.  Return precautions discussed and provided. Patient discharged in stable condition with no unaddressed concerns.   Final Clinical Impressions(s) / ED Diagnoses   Final diagnoses:  Gastroenteritis    ED Discharge Orders         Ordered    ondansetron (ZOFRAN ODT) 4 MG disintegrating tablet  Every 8 hours PRN     11/30/18 2351    pantoprazole (PROTONIX) 20 MG tablet  Daily      11/30/18 2351           Antony Madura, PA-C 12/01/18 0331    Tilden Fossa, MD 12/01/18 2312

## 2018-11-30 NOTE — ED Notes (Signed)
Pt unable to void at this time. 

## 2018-11-30 NOTE — Discharge Instructions (Signed)
Avoid fried foods, fatty foods, greasy foods, and milk products until symptoms resolve. Drink plenty of clear liquids to prevent dehydration. We recommend the use of Zofran as prescribed for nausea/vomiting. Follow-up with your primary care doctor to ensure resolution of symptoms. 

## 2018-11-30 NOTE — ED Triage Notes (Signed)
Pt reports generalized abd pain, nausea and diarrhea of 2 days. Pt seen at [facility] and told it could be gastritis, sent here for further eval. Pt a.o, nad noted

## 2018-12-01 LAB — URINALYSIS, ROUTINE W REFLEX MICROSCOPIC
Bilirubin Urine: NEGATIVE
Glucose, UA: NEGATIVE mg/dL
Hgb urine dipstick: NEGATIVE
Ketones, ur: 5 mg/dL — AB
Leukocytes,Ua: NEGATIVE
Nitrite: NEGATIVE
Protein, ur: NEGATIVE mg/dL
Specific Gravity, Urine: 1.04 — ABNORMAL HIGH (ref 1.005–1.030)
pH: 6 (ref 5.0–8.0)

## 2019-10-21 IMAGING — CT CT ABDOMEN AND PELVIS WITH CONTRAST
2 of 4 series · 16 of 46 positions shown, 18 images · IV contrast (APPLIED)
Comparison: None.

CLINICAL DATA: Abdominal pain, nausea and diarrhea for 2 days.

EXAM:
CT ABDOMEN AND PELVIS WITH CONTRAST
TECHNIQUE: Multidetector CT imaging of the abdomen and pelvis was performed
using the standard protocol following bolus administration of
intravenous contrast.
CONTRAST:  100 mL OMNIPAQUE IOHEXOL 300 MG/ML  SOLN

[Series 3: abdomen 5.0 · axial · 0.83mm/px · z∈[+688,+1093]mm · 13 of 93 slices shown, 15 images]
[im 6/93  soft-tissue]
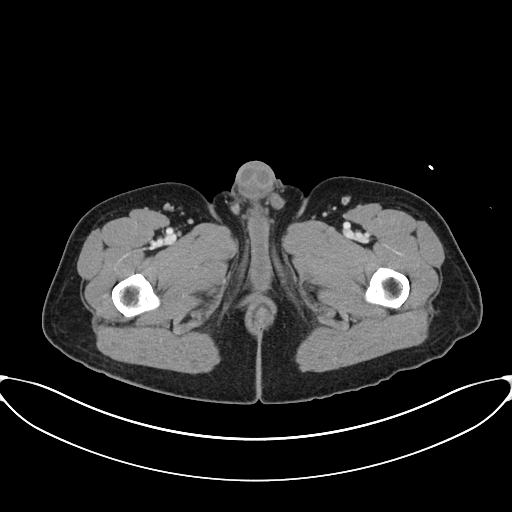
[im 6/93  bone]
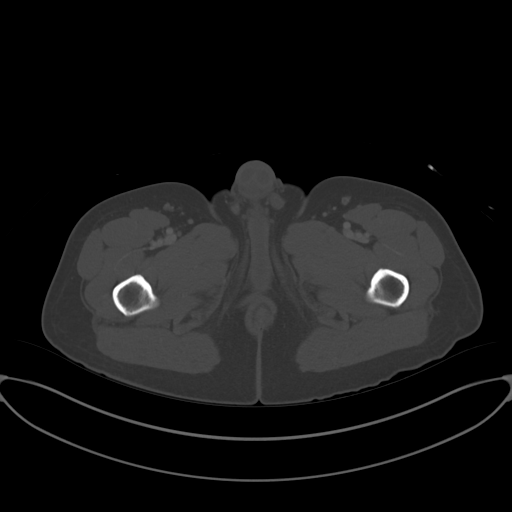
[im 12/93  soft-tissue]
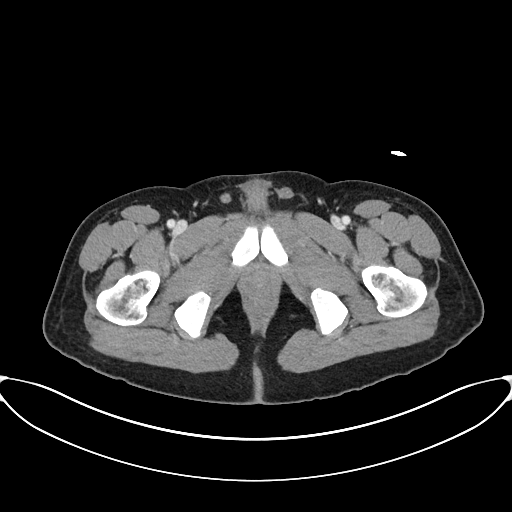
[im 18/93  soft-tissue]
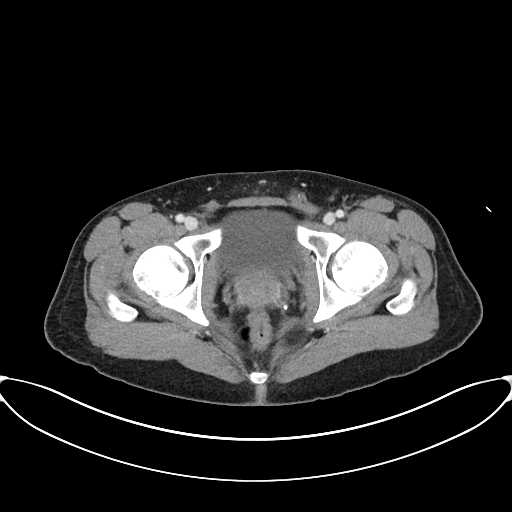
[im 29/93  soft-tissue]
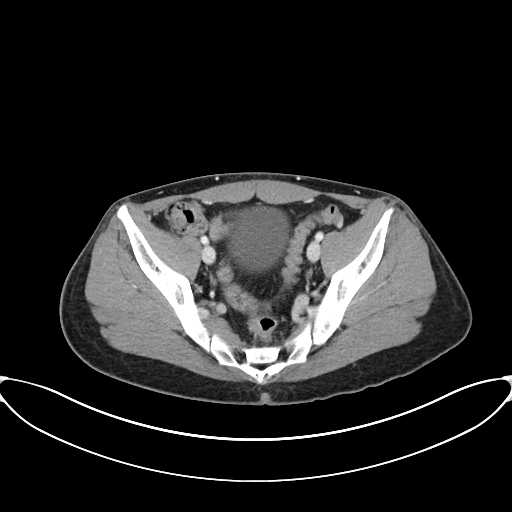
[im 35/93  soft-tissue]
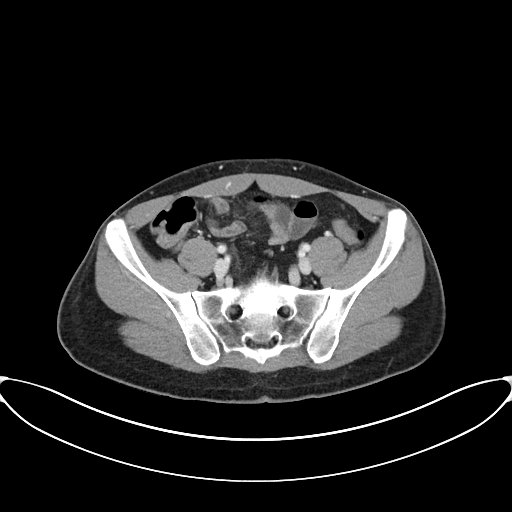
[im 41/93  soft-tissue]
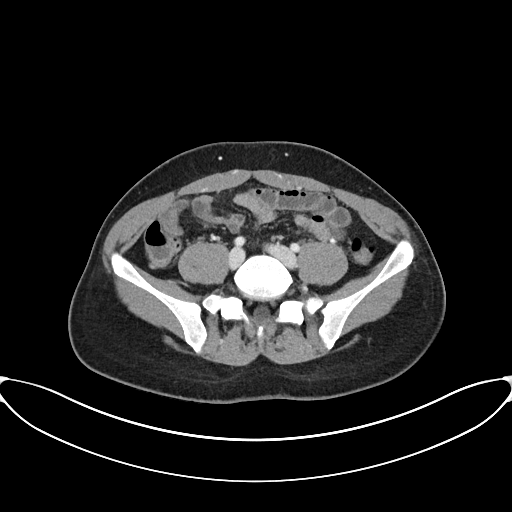
[im 47/93  soft-tissue]
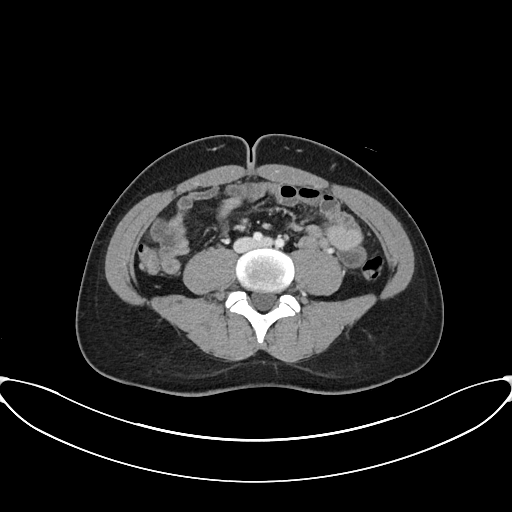
[im 52/93  soft-tissue]
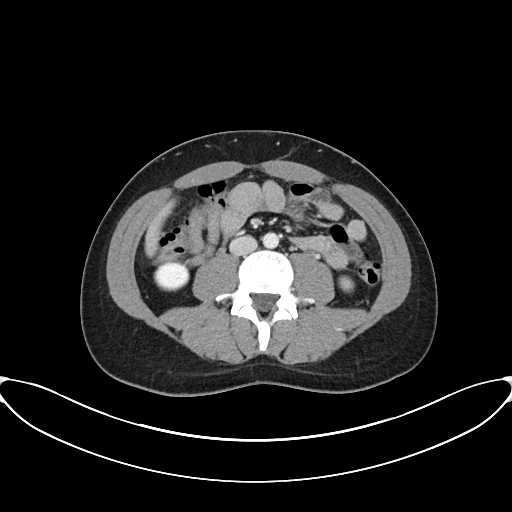
[im 58/93  soft-tissue]
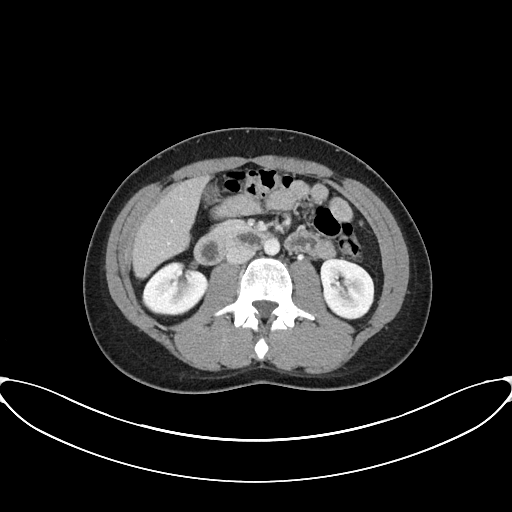
[im 58/93  bone]
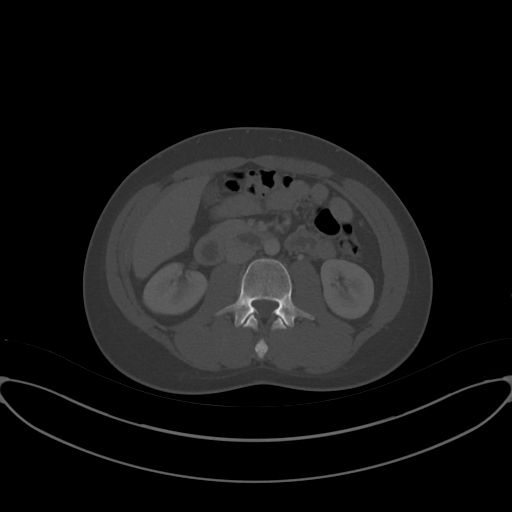
[im 64/93  soft-tissue]
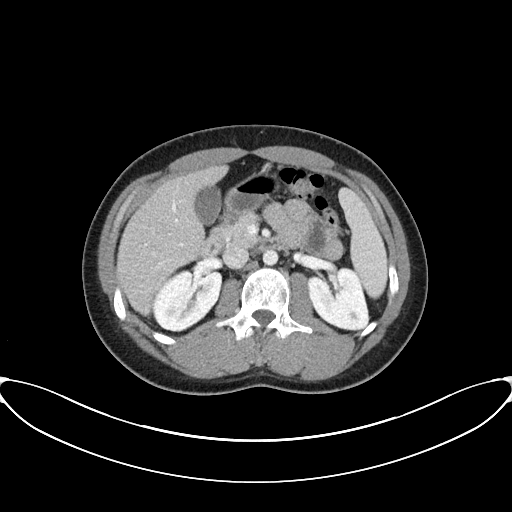
[im 75/93  soft-tissue]
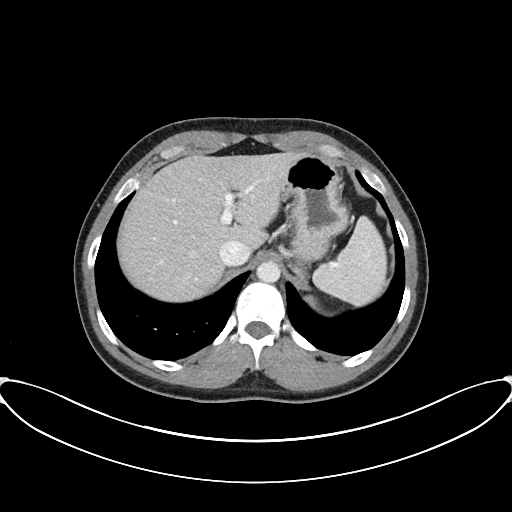
[im 81/93  soft-tissue]
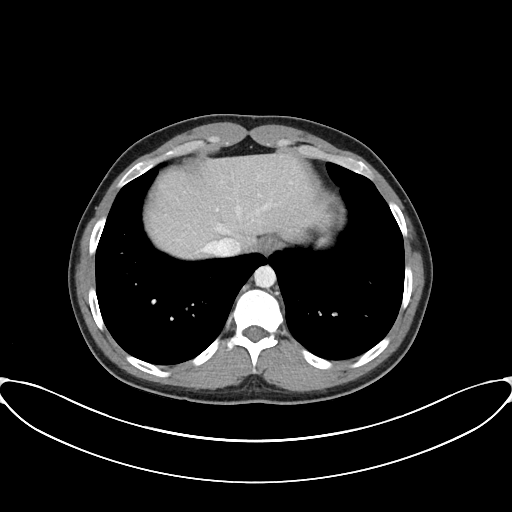
[im 87/93  soft-tissue]
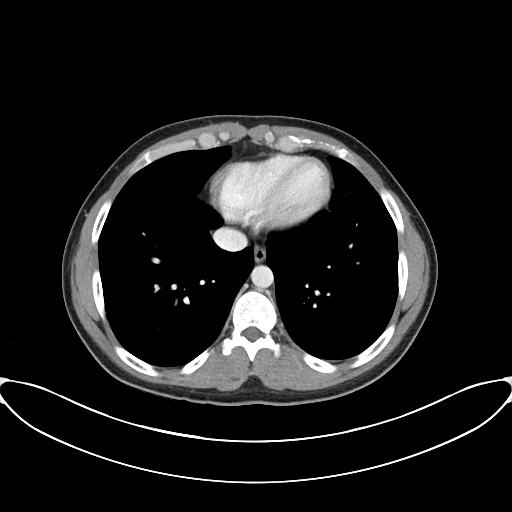

[Series 6: abdomen 3.0 mpr cor · coronal · 0.66mm/px · 3 of 100 slices shown]
[im 34/100  soft-tissue]
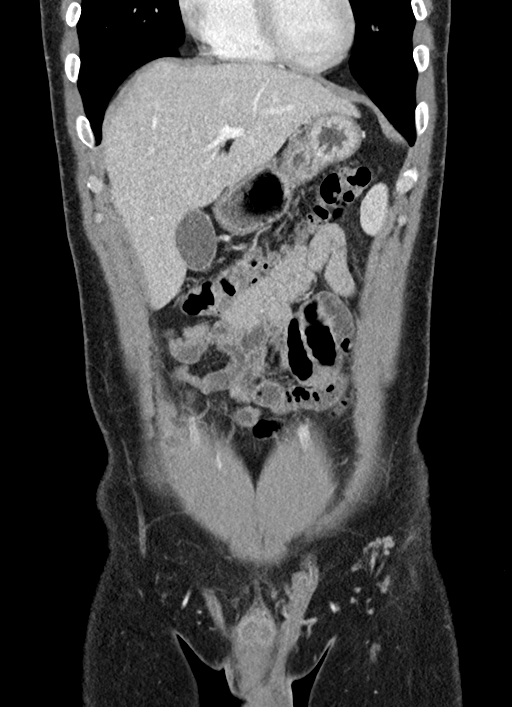
[im 45/100  soft-tissue]
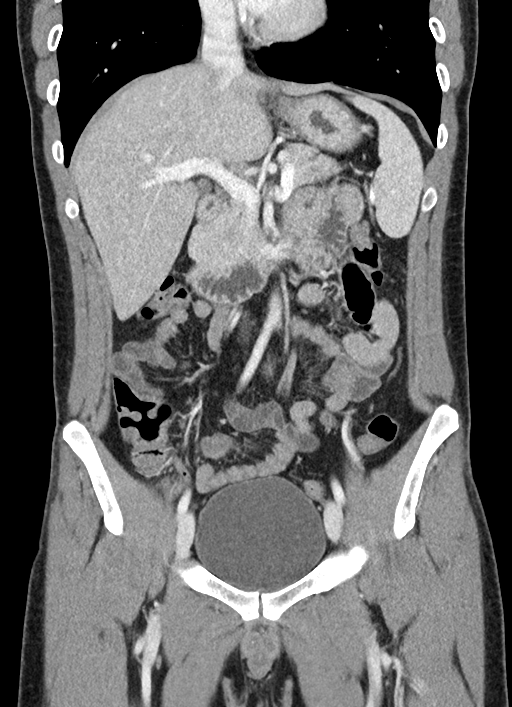
[im 56/100  soft-tissue]
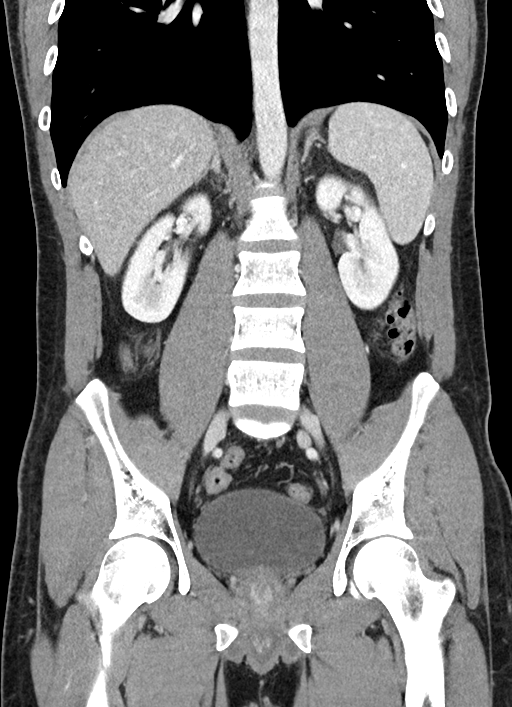

[16 of 46 positions shown; findings below may reference images not displayed]

FINDINGS: Lower chest: Lung bases clear. Heart size normal. No pneumothorax or
pleural fluid.

Hepatobiliary: No focal liver abnormality is seen. No gallstones,
gallbladder wall thickening, or biliary dilatation.

Pancreas: Unremarkable. No pancreatic ductal dilatation or
surrounding inflammatory changes.

Spleen: Normal in size without focal abnormality.

Adrenals/Urinary Tract: Adrenal glands are unremarkable. Kidneys are
normal, without renal calculi, focal lesion, or hydronephrosis.
Bladder is unremarkable.

Stomach/Bowel: Stomach is within normal limits. Appendix appears
normal. No evidence of bowel wall thickening, distention, or
inflammatory changes.

Vascular/Lymphatic: No significant vascular findings are present. No
enlarged abdominal or pelvic lymph nodes.

Reproductive: Prostate is unremarkable.

Other: None.

Musculoskeletal: Negative.
IMPRESSION: Negative CT abdomen and pelvis. No finding to explain the patient's
symptoms.
